# Patient Record
Sex: Female | Born: 1995 | Race: Black or African American | Hispanic: No | Smoking: Current every day smoker
Health system: Southern US, Community
[De-identification: ages and names within clinical notes are randomized; demographics above are authoritative.]

## PROBLEM LIST (undated history)

## (undated) DIAGNOSIS — F329 Major depressive disorder, single episode, unspecified: Secondary | ICD-10-CM

## (undated) HISTORY — PX: ANKLE SURGERY: SHX546

---

## 2015-04-06 ENCOUNTER — Encounter (HOSPITAL_COMMUNITY): Payer: Self-pay

## 2015-04-06 ENCOUNTER — Emergency Department (HOSPITAL_COMMUNITY): Payer: Medicaid Other

## 2015-04-06 ENCOUNTER — Inpatient Hospital Stay (HOSPITAL_COMMUNITY)
Admission: EM | Admit: 2015-04-06 | Discharge: 2015-04-16 | DRG: 988 | Disposition: A | Discharge status: Home / Self Care | Payer: Medicaid Other | Attending: Internal Medicine | Admitting: Internal Medicine

## 2015-04-06 DIAGNOSIS — F1020 Alcohol dependence, uncomplicated: Secondary | ICD-10-CM | POA: Diagnosis present

## 2015-04-06 DIAGNOSIS — F329 Major depressive disorder, single episode, unspecified: Secondary | ICD-10-CM | POA: Diagnosis present

## 2015-04-06 DIAGNOSIS — D72829 Elevated white blood cell count, unspecified: Secondary | ICD-10-CM | POA: Diagnosis present

## 2015-04-06 DIAGNOSIS — S0181XA Laceration without foreign body of other part of head, initial encounter: Secondary | ICD-10-CM | POA: Diagnosis present

## 2015-04-06 DIAGNOSIS — S01112A Laceration without foreign body of left eyelid and periocular area, initial encounter: Secondary | ICD-10-CM | POA: Diagnosis present

## 2015-04-06 DIAGNOSIS — Y905 Blood alcohol level of 100-119 mg/100 ml: Secondary | ICD-10-CM | POA: Diagnosis present

## 2015-04-06 DIAGNOSIS — H538 Other visual disturbances: Secondary | ICD-10-CM | POA: Diagnosis present

## 2015-04-06 DIAGNOSIS — H1130 Conjunctival hemorrhage, unspecified eye: Secondary | ICD-10-CM | POA: Diagnosis not present

## 2015-04-06 DIAGNOSIS — E860 Dehydration: Secondary | ICD-10-CM | POA: Diagnosis present

## 2015-04-06 DIAGNOSIS — N390 Urinary tract infection, site not specified: Secondary | ICD-10-CM

## 2015-04-06 DIAGNOSIS — S0993XA Unspecified injury of face, initial encounter: Secondary | ICD-10-CM | POA: Diagnosis present

## 2015-04-06 DIAGNOSIS — F17210 Nicotine dependence, cigarettes, uncomplicated: Secondary | ICD-10-CM | POA: Diagnosis present

## 2015-04-06 DIAGNOSIS — F10120 Alcohol abuse with intoxication, uncomplicated: Secondary | ICD-10-CM | POA: Diagnosis present

## 2015-04-06 DIAGNOSIS — T796XXA Traumatic ischemia of muscle, initial encounter: Secondary | ICD-10-CM | POA: Diagnosis not present

## 2015-04-06 DIAGNOSIS — T1490 Injury, unspecified: Secondary | ICD-10-CM | POA: Diagnosis not present

## 2015-04-06 DIAGNOSIS — D649 Anemia, unspecified: Secondary | ICD-10-CM | POA: Diagnosis present

## 2015-04-06 DIAGNOSIS — S01111A Laceration without foreign body of right eyelid and periocular area, initial encounter: Secondary | ICD-10-CM | POA: Diagnosis present

## 2015-04-06 DIAGNOSIS — B9620 Unspecified Escherichia coli [E. coli] as the cause of diseases classified elsewhere: Secondary | ICD-10-CM | POA: Diagnosis not present

## 2015-04-06 DIAGNOSIS — N179 Acute kidney failure, unspecified: Secondary | ICD-10-CM | POA: Diagnosis present

## 2015-04-06 DIAGNOSIS — K5900 Constipation, unspecified: Secondary | ICD-10-CM | POA: Diagnosis not present

## 2015-04-06 DIAGNOSIS — R740 Nonspecific elevation of levels of transaminase and lactic acid dehydrogenase [LDH]: Secondary | ICD-10-CM | POA: Diagnosis not present

## 2015-04-06 DIAGNOSIS — H539 Unspecified visual disturbance: Secondary | ICD-10-CM

## 2015-04-06 DIAGNOSIS — S81012A Laceration without foreign body, left knee, initial encounter: Secondary | ICD-10-CM | POA: Diagnosis present

## 2015-04-06 DIAGNOSIS — T1490XA Injury, unspecified, initial encounter: Secondary | ICD-10-CM

## 2015-04-06 DIAGNOSIS — S0993XS Unspecified injury of face, sequela: Secondary | ICD-10-CM | POA: Diagnosis not present

## 2015-04-06 DIAGNOSIS — E876 Hypokalemia: Secondary | ICD-10-CM | POA: Diagnosis present

## 2015-04-06 DIAGNOSIS — R000 Tachycardia, unspecified: Secondary | ICD-10-CM | POA: Diagnosis not present

## 2015-04-06 DIAGNOSIS — M6282 Rhabdomyolysis: Secondary | ICD-10-CM

## 2015-04-06 DIAGNOSIS — F10220 Alcohol dependence with intoxication, uncomplicated: Secondary | ICD-10-CM | POA: Diagnosis not present

## 2015-04-06 DIAGNOSIS — T796XXD Traumatic ischemia of muscle, subsequent encounter: Secondary | ICD-10-CM | POA: Diagnosis not present

## 2015-04-06 DIAGNOSIS — S0993XD Unspecified injury of face, subsequent encounter: Secondary | ICD-10-CM | POA: Diagnosis not present

## 2015-04-06 DIAGNOSIS — R112 Nausea with vomiting, unspecified: Secondary | ICD-10-CM | POA: Diagnosis present

## 2015-04-06 DIAGNOSIS — R1110 Vomiting, unspecified: Secondary | ICD-10-CM | POA: Insufficient documentation

## 2015-04-06 DIAGNOSIS — R7401 Elevation of levels of liver transaminase levels: Secondary | ICD-10-CM | POA: Insufficient documentation

## 2015-04-06 DIAGNOSIS — R509 Fever, unspecified: Secondary | ICD-10-CM | POA: Diagnosis not present

## 2015-04-06 DIAGNOSIS — F1910 Other psychoactive substance abuse, uncomplicated: Secondary | ICD-10-CM | POA: Diagnosis present

## 2015-04-06 DIAGNOSIS — I959 Hypotension, unspecified: Secondary | ICD-10-CM | POA: Diagnosis not present

## 2015-04-06 DIAGNOSIS — S71112A Laceration without foreign body, left thigh, initial encounter: Secondary | ICD-10-CM | POA: Diagnosis present

## 2015-04-06 DIAGNOSIS — F32A Depression, unspecified: Secondary | ICD-10-CM | POA: Diagnosis present

## 2015-04-06 DIAGNOSIS — IMO0002 Reserved for concepts with insufficient information to code with codable children: Secondary | ICD-10-CM

## 2015-04-06 HISTORY — DX: Depression, unspecified: F32.A

## 2015-04-06 HISTORY — DX: Major depressive disorder, single episode, unspecified: F32.9

## 2015-04-06 LAB — CBC WITH DIFFERENTIAL/PLATELET
BASOS PCT: 0 %
Basophils Absolute: 0 10*3/uL (ref 0.0–0.1)
EOS PCT: 0 %
Eosinophils Absolute: 0 10*3/uL (ref 0.0–0.7)
HEMATOCRIT: 33.2 % — AB (ref 36.0–46.0)
Hemoglobin: 11.3 g/dL — ABNORMAL LOW (ref 12.0–15.0)
Lymphocytes Relative: 6 %
Lymphs Abs: 1.7 10*3/uL (ref 0.7–4.0)
MCH: 31 pg (ref 26.0–34.0)
MCHC: 34 g/dL (ref 30.0–36.0)
MCV: 91.2 fL (ref 78.0–100.0)
MONO ABS: 3.5 10*3/uL — AB (ref 0.1–1.0)
MONOS PCT: 12 %
NEUTROS PCT: 82 %
Neutro Abs: 23.6 10*3/uL — ABNORMAL HIGH (ref 1.7–7.7)
PLATELETS: 218 10*3/uL (ref 150–400)
RBC: 3.64 MIL/uL — AB (ref 3.87–5.11)
RDW: 13.8 % (ref 11.5–15.5)
WBC: 28.8 10*3/uL — AB (ref 4.0–10.5)

## 2015-04-06 LAB — I-STAT BETA HCG BLOOD, ED (MC, WL, AP ONLY)

## 2015-04-06 LAB — COMPREHENSIVE METABOLIC PANEL
ALK PHOS: 67 U/L (ref 38–126)
ALT: 32 U/L (ref 14–54)
AST: 101 U/L — AB (ref 15–41)
Albumin: 4.3 g/dL (ref 3.5–5.0)
Anion gap: 20 — ABNORMAL HIGH (ref 5–15)
BILIRUBIN TOTAL: 0.6 mg/dL (ref 0.3–1.2)
BUN: 16 mg/dL (ref 6–20)
CALCIUM: 9 mg/dL (ref 8.9–10.3)
CO2: 15 mmol/L — ABNORMAL LOW (ref 22–32)
CREATININE: 1.41 mg/dL — AB (ref 0.44–1.00)
Chloride: 108 mmol/L (ref 101–111)
GFR, EST NON AFRICAN AMERICAN: 53 mL/min — AB (ref >60)
Glucose, Bld: 68 mg/dL (ref 65–99)
Potassium: 3.1 mmol/L — ABNORMAL LOW (ref 3.5–5.1)
Sodium: 143 mmol/L (ref 135–145)
TOTAL PROTEIN: 7.7 g/dL (ref 6.5–8.1)

## 2015-04-06 LAB — RAPID URINE DRUG SCREEN, HOSP PERFORMED
AMPHETAMINES: NOT DETECTED
Barbiturates: POSITIVE — AB
Benzodiazepines: NOT DETECTED
Cocaine: NOT DETECTED
OPIATES: NOT DETECTED
Tetrahydrocannabinol: POSITIVE — AB

## 2015-04-06 LAB — URINALYSIS, ROUTINE W REFLEX MICROSCOPIC
Bilirubin Urine: NEGATIVE
Glucose, UA: NEGATIVE mg/dL
Ketones, ur: 40 mg/dL — AB
Leukocytes, UA: NEGATIVE
Nitrite: NEGATIVE
Protein, ur: 100 mg/dL — AB
Specific Gravity, Urine: 1.036 — ABNORMAL HIGH (ref 1.005–1.030)
pH: 5.5 (ref 5.0–8.0)

## 2015-04-06 LAB — MAGNESIUM: Magnesium: 2.3 mg/dL (ref 1.7–2.4)

## 2015-04-06 LAB — URINE MICROSCOPIC-ADD ON

## 2015-04-06 LAB — MRSA PCR SCREENING: MRSA by PCR: NEGATIVE

## 2015-04-06 LAB — SAMPLE TO BLOOD BANK

## 2015-04-06 LAB — ETHANOL: ALCOHOL ETHYL (B): 118 mg/dL — AB (ref <5)

## 2015-04-06 MED ORDER — VITAMIN B-1 100 MG PO TABS
100.0000 mg | ORAL_TABLET | Freq: Every day | ORAL | Status: DC
  Administered 2015-04-08 – 2015-04-16 (×9): 100 mg via ORAL
  Filled 2015-04-06 (×9): qty 1

## 2015-04-06 MED ORDER — ADULT MULTIVITAMIN W/MINERALS CH
1.0000 | ORAL_TABLET | Freq: Every day | ORAL | Status: DC
  Administered 2015-04-07 – 2015-04-16 (×10): 1 via ORAL
  Filled 2015-04-06 (×10): qty 1

## 2015-04-06 MED ORDER — MAGNESIUM CITRATE PO SOLN: 1.0000 | Freq: Once | ORAL | Status: DC | PRN

## 2015-04-06 MED ORDER — IPRATROPIUM BROMIDE 0.02 % IN SOLN: 0.5000 mg | RESPIRATORY_TRACT | Status: DC | PRN

## 2015-04-06 MED ORDER — MORPHINE SULFATE (PF) 2 MG/ML IV SOLN
2.0000 mg | INTRAVENOUS | Status: DC | PRN
  Administered 2015-04-06 – 2015-04-13 (×27): 2 mg via INTRAVENOUS
  Filled 2015-04-06 (×29): qty 1

## 2015-04-06 MED ORDER — ACETAMINOPHEN 325 MG PO TABS
650.0000 mg | ORAL_TABLET | Freq: Four times a day (QID) | ORAL | Status: DC | PRN
  Administered 2015-04-09: 650 mg via ORAL
  Filled 2015-04-06: qty 2

## 2015-04-06 MED ORDER — SENNOSIDES-DOCUSATE SODIUM 8.6-50 MG PO TABS: 1.0000 | ORAL_TABLET | Freq: Every evening | ORAL | Status: DC | PRN

## 2015-04-06 MED ORDER — ONDANSETRON HCL 4 MG/2ML IJ SOLN
4.0000 mg | Freq: Once | INTRAMUSCULAR | Status: AC
  Administered 2015-04-06: 4 mg via INTRAVENOUS
  Filled 2015-04-06: qty 2

## 2015-04-06 MED ORDER — LORAZEPAM 1 MG PO TABS
2.0000 mg | ORAL_TABLET | Freq: Four times a day (QID) | ORAL | Status: AC | PRN
  Administered 2015-04-08: 2 mg via ORAL
  Filled 2015-04-06: qty 2

## 2015-04-06 MED ORDER — SODIUM CHLORIDE 0.9 % IV BOLUS (SEPSIS)
1000.0000 mL | Freq: Once | INTRAVENOUS | Status: AC
  Administered 2015-04-06: 1000 mL via INTRAVENOUS

## 2015-04-06 MED ORDER — ACETAMINOPHEN 650 MG RE SUPP
650.0000 mg | Freq: Four times a day (QID) | RECTAL | Status: DC | PRN
  Administered 2015-04-10: 650 mg via RECTAL
  Filled 2015-04-06: qty 1

## 2015-04-06 MED ORDER — DOCUSATE SODIUM 100 MG PO CAPS
100.0000 mg | ORAL_CAPSULE | Freq: Two times a day (BID) | ORAL | Status: DC
  Administered 2015-04-06 – 2015-04-16 (×19): 100 mg via ORAL
  Filled 2015-04-06 (×20): qty 1

## 2015-04-06 MED ORDER — BUPIVACAINE HCL (PF) 0.5 % IJ SOLN
20.0000 mL | Freq: Once | INTRAMUSCULAR | Status: AC
  Administered 2015-04-06: 20 mL
  Filled 2015-04-06: qty 30

## 2015-04-06 MED ORDER — PROCHLORPERAZINE EDISYLATE 5 MG/ML IJ SOLN
10.0000 mg | Freq: Four times a day (QID) | INTRAMUSCULAR | Status: DC | PRN
  Administered 2015-04-06: 10 mg via INTRAVENOUS
  Filled 2015-04-06 (×2): qty 2

## 2015-04-06 MED ORDER — SODIUM CHLORIDE 0.9 % IV SOLN
8.0000 mg | Freq: Four times a day (QID) | INTRAVENOUS | Status: DC | PRN
Start: 2015-04-06 — End: 2015-04-16
  Administered 2015-04-06: 8 mg via INTRAVENOUS
  Filled 2015-04-06: qty 4

## 2015-04-06 MED ORDER — SODIUM CHLORIDE 0.9 % IJ SOLN
3.0000 mL | Freq: Two times a day (BID) | INTRAMUSCULAR | Status: DC
  Administered 2015-04-07 – 2015-04-14 (×14): 3 mL via INTRAVENOUS

## 2015-04-06 MED ORDER — POTASSIUM CHLORIDE 2 MEQ/ML IV SOLN
INTRAVENOUS | Status: DC
  Administered 2015-04-06 – 2015-04-10 (×6): via INTRAVENOUS
  Filled 2015-04-06 (×16): qty 1000

## 2015-04-06 MED ORDER — ENOXAPARIN SODIUM 40 MG/0.4ML ~~LOC~~ SOLN
40.0000 mg | SUBCUTANEOUS | Status: DC
  Administered 2015-04-06: 40 mg via SUBCUTANEOUS
  Filled 2015-04-06: qty 0.4

## 2015-04-06 MED ORDER — FLUOXETINE HCL 20 MG PO CAPS
20.0000 mg | ORAL_CAPSULE | Freq: Every day | ORAL | Status: DC
  Administered 2015-04-06 – 2015-04-09 (×4): 20 mg via ORAL
  Filled 2015-04-06 (×4): qty 1

## 2015-04-06 MED ORDER — BACITRACIN ZINC 500 UNIT/GM EX OINT
TOPICAL_OINTMENT | Freq: Two times a day (BID) | CUTANEOUS | Status: DC
  Administered 2015-04-06: 1 via TOPICAL
  Administered 2015-04-06 – 2015-04-16 (×19): via TOPICAL
  Filled 2015-04-06 (×10): qty 28.35
  Filled 2015-04-06: qty 3.6
  Filled 2015-04-06: qty 28.35

## 2015-04-06 MED ORDER — IOHEXOL 300 MG/ML  SOLN
80.0000 mL | Freq: Once | INTRAMUSCULAR | Status: AC | PRN
  Administered 2015-04-06: 80 mL via INTRAVENOUS

## 2015-04-06 MED ORDER — MIRTAZAPINE 15 MG PO TABS: 15.0000 mg | ORAL_TABLET | Freq: Every evening | ORAL | Status: DC | PRN

## 2015-04-06 MED ORDER — MORPHINE SULFATE (PF) 4 MG/ML IV SOLN
4.0000 mg | Freq: Once | INTRAVENOUS | Status: AC
  Administered 2015-04-06: 4 mg via INTRAVENOUS
  Filled 2015-04-06: qty 1

## 2015-04-06 MED ORDER — FOLIC ACID 1 MG PO TABS
1.0000 mg | ORAL_TABLET | Freq: Every day | ORAL | Status: DC
  Administered 2015-04-07 – 2015-04-16 (×10): 1 mg via ORAL
  Filled 2015-04-06 (×10): qty 1

## 2015-04-06 MED ORDER — ONDANSETRON HCL 4 MG/2ML IJ SOLN
4.0000 mg | Freq: Once | INTRAMUSCULAR | Status: AC
Start: 2015-04-06 — End: 2015-04-06
  Administered 2015-04-06: 4 mg via INTRAVENOUS
  Filled 2015-04-06: qty 2

## 2015-04-06 MED ORDER — POTASSIUM CHLORIDE CRYS ER 20 MEQ PO TBCR
40.0000 meq | EXTENDED_RELEASE_TABLET | Freq: Once | ORAL | Status: DC
  Filled 2015-04-06: qty 2

## 2015-04-06 MED ORDER — LORAZEPAM 2 MG/ML IJ SOLN: 2.0000 mg | Freq: Four times a day (QID) | INTRAMUSCULAR | Status: AC | PRN

## 2015-04-06 MED ORDER — SORBITOL 70 % SOLN
30.0000 mL | Freq: Every day | Status: DC | PRN
  Filled 2015-04-06: qty 30

## 2015-04-06 MED ORDER — THIAMINE HCL 100 MG/ML IJ SOLN
100.0000 mg | Freq: Every day | INTRAMUSCULAR | Status: DC
  Administered 2015-04-06 – 2015-04-07 (×2): 100 mg via INTRAVENOUS
  Filled 2015-04-06 (×2): qty 2

## 2015-04-06 MED ORDER — LEVALBUTEROL HCL 0.63 MG/3ML IN NEBU: 0.6300 mg | INHALATION_SOLUTION | RESPIRATORY_TRACT | Status: DC | PRN

## 2015-04-06 MED ORDER — OXYCODONE HCL 5 MG PO TABS
5.0000 mg | ORAL_TABLET | ORAL | Status: DC | PRN
  Administered 2015-04-07 – 2015-04-16 (×37): 5 mg via ORAL
  Filled 2015-04-06 (×38): qty 1
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
## 2015-04-06 NOTE — ED Notes (Signed)
ELECTRIC MONITORING DEVICE TO LLE INTACT

## 2015-04-06 NOTE — ED Notes (Signed)
Pt at present on bed pan attempting urine sample
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
## 2015-04-06 NOTE — ED Notes (Signed)
EDPA SUTURING AT BS

## 2015-04-06 NOTE — ED Notes (Signed)
Patient transported to CT 

## 2015-04-06 NOTE — ED Notes (Signed)
INQUIRED ABOUT B[facility] STATUS AT [facility]. KAREN UNIT SECT CALLED-NOT AVAILABLE (BEING CLEANED)

## 2015-04-06 NOTE — ED Notes (Signed)
Pt's clothing (gray sweat pants and gray sweat shirt) retrieved from garbage by CSI A.J. Norberto SorensonWashburn.  Boots remain at bedside.

## 2015-04-06 NOTE — ED Notes (Signed)
CSW PRESENT SPEAKING WITH PT
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
## 2015-04-06 NOTE — ED Notes (Signed)
MD at bedside. ADMITTING Paula Cortez PRESENT

## 2015-04-06 NOTE — ED Notes (Signed)
ADMITTING MD DECLINED ADMISSION BED HERE AT [place] [facility]. CHARGE ALAINA RN MADE AWARE.
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
## 2015-04-06 NOTE — Progress Notes (Signed)
Received [facility] from [facility] after getting report from [place]. Pt placed on CCM; CHG bath given, MRSA swab obtained.  CCMD notified. VSS. Noted swelling to both orbitals and sutured lacs to forehead, lt thigh lt knee and above rt eye. Alert and oriented x 4.
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
## 2015-04-06 NOTE — ED Notes (Signed)
[employer] present speaking with pt

## 2015-04-06 NOTE — ED Notes (Signed)
Pt can go to floor at 18:43
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
## 2015-04-06 NOTE — ED Notes (Signed)
Pt placed on bedpan but unable to void at this time.
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
## 2015-04-06 NOTE — ED Notes (Signed)
CALL TRAUMA WHEN PT ARRIVES TO FLOOR AT [facility]
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
## 2015-04-06 NOTE — ED Notes (Signed)
Nurse drawing labs. 

## 2015-04-06 NOTE — ED Notes (Signed)
OFFICER [employer]ACE PRESENT TO ASSIST PT WITH REPORT

## 2015-04-07 DIAGNOSIS — F329 Major depressive disorder, single episode, unspecified: Secondary | ICD-10-CM

## 2015-04-07 DIAGNOSIS — I9589 Other hypotension: Secondary | ICD-10-CM

## 2015-04-07 DIAGNOSIS — D72829 Elevated white blood cell count, unspecified: Secondary | ICD-10-CM

## 2015-04-07 DIAGNOSIS — D62 Acute posthemorrhagic anemia: Secondary | ICD-10-CM

## 2015-04-07 LAB — COMPREHENSIVE METABOLIC PANEL
ALBUMIN: 2.9 g/dL — AB (ref 3.5–5.0)
ALK PHOS: 41 U/L (ref 38–126)
ALT: 54 U/L (ref 14–54)
AST: 281 U/L — AB (ref 15–41)
Anion gap: 8 (ref 5–15)
BILIRUBIN TOTAL: 0.7 mg/dL (ref 0.3–1.2)
BUN: 9 mg/dL (ref 6–20)
CO2: 20 mmol/L — ABNORMAL LOW (ref 22–32)
CREATININE: 0.78 mg/dL (ref 0.44–1.00)
Calcium: 8.1 mg/dL — ABNORMAL LOW (ref 8.9–10.3)
Chloride: 108 mmol/L (ref 101–111)
GFR calc Af Amer: >60 mL/min (ref >60)
GLUCOSE: 85 mg/dL (ref 65–99)
Potassium: 4.6 mmol/L (ref 3.5–5.1)
Sodium: 136 mmol/L (ref 135–145)
TOTAL PROTEIN: 5.4 g/dL — AB (ref 6.5–8.1)

## 2015-04-07 LAB — HIV ANTIBODY (ROUTINE TESTING W REFLEX): HIV Screen 4th Generation wRfx: NONREACTIVE

## 2015-04-07 LAB — CBC WITH DIFFERENTIAL/PLATELET
BASOS ABS: 0 10*3/uL (ref 0.0–0.1)
Basophils Relative: 0 %
EOS ABS: 0 10*3/uL (ref 0.0–0.7)
EOS PCT: 0 %
HCT: 22.4 % — ABNORMAL LOW (ref 36.0–46.0)
Hemoglobin: 7.6 g/dL — ABNORMAL LOW (ref 12.0–15.0)
LYMPHS PCT: 21 %
Lymphs Abs: 2.5 10*3/uL (ref 0.7–4.0)
MCH: 30.5 pg (ref 26.0–34.0)
MCHC: 33.9 g/dL (ref 30.0–36.0)
MCV: 90 fL (ref 78.0–100.0)
MONO ABS: 1 10*3/uL (ref 0.1–1.0)
Monocytes Relative: 8 %
Neutro Abs: 8.5 10*3/uL — ABNORMAL HIGH (ref 1.7–7.7)
Neutrophils Relative %: 71 %
PLATELETS: 130 10*3/uL — AB (ref 150–400)
RBC: 2.49 MIL/uL — AB (ref 3.87–5.11)
RDW: 14.1 % (ref 11.5–15.5)
WBC: 12 10*3/uL — AB (ref 4.0–10.5)

## 2015-04-07 LAB — IRON AND TIBC
IRON: 90 ug/dL (ref 28–170)
SATURATION RATIOS: 30 % (ref 10.4–31.8)
TIBC: 301 ug/dL (ref 250–450)
UIBC: 211 ug/dL

## 2015-04-07 LAB — HEMOGLOBIN AND HEMATOCRIT, BLOOD
HEMATOCRIT: 23.5 % — AB (ref 36.0–46.0)
Hemoglobin: 8 g/dL — ABNORMAL LOW (ref 12.0–15.0)

## 2015-04-07 MED ORDER — SODIUM CHLORIDE 0.9 % IV SOLN
500.0000 mL | Freq: Once | INTRAVENOUS | Status: AC
  Administered 2015-04-07: 500 mL via INTRAVENOUS

## 2015-04-07 MED ORDER — SODIUM CHLORIDE 0.9 % IV BOLUS (SEPSIS)
1000.0000 mL | Freq: Once | INTRAVENOUS | Status: AC
  Administered 2015-04-07: 1000 mL via INTRAVENOUS
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
## 2015-04-07 NOTE — Progress Notes (Signed)
Utilization Review Completed.  
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
## 2015-04-08 ENCOUNTER — Inpatient Hospital Stay (HOSPITAL_COMMUNITY): Payer: Medicaid Other

## 2015-04-08 DIAGNOSIS — D649 Anemia, unspecified: Secondary | ICD-10-CM

## 2015-04-08 LAB — BASIC METABOLIC PANEL
Anion gap: 7 (ref 5–15)
CHLORIDE: 106 mmol/L (ref 101–111)
CO2: 22 mmol/L (ref 22–32)
CREATININE: 0.77 mg/dL (ref 0.44–1.00)
Calcium: 8.3 mg/dL — ABNORMAL LOW (ref 8.9–10.3)
GFR calc non Af Amer: >60 mL/min (ref >60)
Glucose, Bld: 144 mg/dL — ABNORMAL HIGH (ref 65–99)
POTASSIUM: 4.2 mmol/L (ref 3.5–5.1)
Sodium: 135 mmol/L (ref 135–145)

## 2015-04-08 LAB — HEMOGLOBIN AND HEMATOCRIT, BLOOD
HCT: 21 % — ABNORMAL LOW (ref 36.0–46.0)
HEMATOCRIT: 26.4 % — AB (ref 36.0–46.0)
HEMOGLOBIN: 9 g/dL — AB (ref 12.0–15.0)
Hemoglobin: 7 g/dL — ABNORMAL LOW (ref 12.0–15.0)

## 2015-04-08 LAB — HEPATIC FUNCTION PANEL
ALBUMIN: 2.7 g/dL — AB (ref 3.5–5.0)
ALT: 107 U/L — ABNORMAL HIGH (ref 14–54)
AST: 491 U/L — AB (ref 15–41)
Alkaline Phosphatase: 38 U/L (ref 38–126)
BILIRUBIN TOTAL: 0.5 mg/dL (ref 0.3–1.2)
Bilirubin, Direct: 0.1 mg/dL (ref 0.1–0.5)
Indirect Bilirubin: 0.4 mg/dL (ref 0.3–0.9)
Total Protein: 5.2 g/dL — ABNORMAL LOW (ref 6.5–8.1)

## 2015-04-08 LAB — URINE CULTURE

## 2015-04-08 LAB — PREPARE RBC (CROSSMATCH)

## 2015-04-08 LAB — ABO/RH: ABO/RH(D): O POS

## 2015-04-08 MED ORDER — SODIUM CHLORIDE 0.9 % IV SOLN: Freq: Once | INTRAVENOUS | Status: DC
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
## 2015-04-08 NOTE — Progress Notes (Signed)
Pt admitted to the unit at 1100 transfer from [facility]. Pt mental status is alert, verbal, oriented x4. Does not want to talk much during conversation. Pt oriented to room, staff, and call bell. Full assessment charted in [facility]. Call bell within reach. Visitor guidelines reviewed w/ pt and/or family. Blood transfusing upon admission, consent form in chart. No adverse reactions at this time, will continue to monitor
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
## 2015-04-09 LAB — TYPE AND SCREEN
ABO/RH(D): O POS
ANTIBODY SCREEN: NEGATIVE
Unit division: 0

## 2015-04-09 LAB — COMPREHENSIVE METABOLIC PANEL
ALBUMIN: 3.1 g/dL — AB (ref 3.5–5.0)
ALK PHOS: 43 U/L (ref 38–126)
ALT: 151 U/L — ABNORMAL HIGH (ref 14–54)
AST: 648 U/L — AB (ref 15–41)
Anion gap: 9 (ref 5–15)
BILIRUBIN TOTAL: 0.4 mg/dL (ref 0.3–1.2)
CO2: 26 mmol/L (ref 22–32)
Calcium: 8.8 mg/dL — ABNORMAL LOW (ref 8.9–10.3)
Chloride: 101 mmol/L (ref 101–111)
Creatinine, Ser: 0.78 mg/dL (ref 0.44–1.00)
GFR calc Af Amer: >60 mL/min (ref >60)
GFR calc non Af Amer: >60 mL/min (ref >60)
GLUCOSE: 111 mg/dL — AB (ref 65–99)
POTASSIUM: 3.8 mmol/L (ref 3.5–5.1)
SODIUM: 136 mmol/L (ref 135–145)
TOTAL PROTEIN: 6.1 g/dL — AB (ref 6.5–8.1)

## 2015-04-09 LAB — HEPATITIS PANEL, ACUTE
HCV Ab: 0.1 s/co ratio (ref 0.0–0.9)
HEP B S AG: NEGATIVE
Hep A IgM: NEGATIVE
Hep B C IgM: NEGATIVE

## 2015-04-09 LAB — HEMOGLOBIN AND HEMATOCRIT, BLOOD
HCT: 26.2 % — ABNORMAL LOW (ref 36.0–46.0)
HEMOGLOBIN: 9.1 g/dL — AB (ref 12.0–15.0)
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
## 2015-04-09 NOTE — Care Management Note (Signed)
Case Management Note  Patient Details  Name: Paula Cortez MRN: [id number] Date of Birth: September 12, 1995  Subjective/Objective:                  Date-04-09-15 Initial Assessment  Spoke with patient at the bedside Introduced self as case manager and explained role in discharge planning and how to be reached.  [place]fri[place]. [name] states that she feels safe returning to the living environment she came from. Verified patient anticipates to go home with boyfriend.  Patient has no DME  Patient denied  needing help with their medication. Patient states she has medicaid Patient is driven by boyfriend or takes the bus to MD appointments.  Verified patient has no PCP. Will follow up with [facility].  Plan: CM will continue to follow for discharge planning and [facility].   Paula Sabalebbie Jenascia Bumpass RN BSN CM [phone]   Action/Plan:  [facility], anticipate DC tomorrow.   Expected Discharge Date:   (unknown)               Expected Discharge Plan:  Home/Self Care  In-House Referral:  Clinical Social Work  Discharge planning Services  CM Consult, Indigent Health Clinic  Post Acute Care Choice:    Choice offered to:     DME Arranged:    DME Agency:     HH Arranged:    HH Agency:     Status of Service:  Completed, signed off  Medicare Important Message Given:    Date Medicare IM Given:    Medicare IM give by:    Date Additional Medicare IM Given:    Additional Medicare Important Message give by:     If discussed at Long Length of Stay Meetings, dates discussed:    Additional Comments:  Paula SabalDebbie Orlanda Lemmerman, RN 04/09/2015, 11:43 AM
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
## 2015-04-10 ENCOUNTER — Inpatient Hospital Stay (HOSPITAL_COMMUNITY): Payer: Medicaid Other

## 2015-04-10 DIAGNOSIS — T796XXA Traumatic ischemia of muscle, initial encounter: Secondary | ICD-10-CM

## 2015-04-10 LAB — CBC
HCT: 24.5 % — ABNORMAL LOW (ref 36.0–46.0)
Hemoglobin: 8.4 g/dL — ABNORMAL LOW (ref 12.0–15.0)
MCH: 30.2 pg (ref 26.0–34.0)
MCHC: 34.3 g/dL (ref 30.0–36.0)
MCV: 88.1 fL (ref 78.0–100.0)
PLATELETS: 192 10*3/uL (ref 150–400)
RBC: 2.78 MIL/uL — AB (ref 3.87–5.11)
RDW: 16.2 % — AB (ref 11.5–15.5)
WBC: 10.7 10*3/uL — AB (ref 4.0–10.5)

## 2015-04-10 LAB — URINE MICROSCOPIC-ADD ON

## 2015-04-10 LAB — BASIC METABOLIC PANEL
Anion gap: 10 (ref 5–15)
CHLORIDE: 106 mmol/L (ref 101–111)
CO2: 21 mmol/L — AB (ref 22–32)
Calcium: 8.8 mg/dL — ABNORMAL LOW (ref 8.9–10.3)
Creatinine, Ser: 0.69 mg/dL (ref 0.44–1.00)
GFR calc non Af Amer: >60 mL/min (ref >60)
Glucose, Bld: 101 mg/dL — ABNORMAL HIGH (ref 65–99)
POTASSIUM: 4.1 mmol/L (ref 3.5–5.1)
SODIUM: 137 mmol/L (ref 135–145)

## 2015-04-10 LAB — URINALYSIS, ROUTINE W REFLEX MICROSCOPIC
Bilirubin Urine: NEGATIVE
Glucose, UA: NEGATIVE mg/dL
Ketones, ur: NEGATIVE mg/dL
NITRITE: NEGATIVE
PH: 7.5 (ref 5.0–8.0)
Protein, ur: 30 mg/dL — AB
SPECIFIC GRAVITY, URINE: 1.01 (ref 1.005–1.030)

## 2015-04-10 LAB — HEPATIC FUNCTION PANEL
ALT: 183 U/L — ABNORMAL HIGH (ref 14–54)
AST: 727 U/L — AB (ref 15–41)
Albumin: 3 g/dL — ABNORMAL LOW (ref 3.5–5.0)
Alkaline Phosphatase: 40 U/L (ref 38–126)
BILIRUBIN DIRECT: 0.2 mg/dL (ref 0.1–0.5)
BILIRUBIN INDIRECT: 0.2 mg/dL — AB (ref 0.3–0.9)
BILIRUBIN TOTAL: 0.4 mg/dL (ref 0.3–1.2)
Total Protein: 5.8 g/dL — ABNORMAL LOW (ref 6.5–8.1)

## 2015-04-10 LAB — PROTIME-INR
INR: 0.97 (ref 0.00–1.49)
Prothrombin Time: 13.1 seconds (ref 11.6–15.2)

## 2015-04-10 LAB — CK: Total CK: >50000 U/L — ABNORMAL HIGH (ref 38–234)

## 2015-04-10 LAB — SALICYLATE LEVEL: Salicylate Lvl: <4 mg/dL (ref 2.8–30.0)

## 2015-04-10 LAB — ACETAMINOPHEN LEVEL: Acetaminophen (Tylenol), Serum: <10 ug/mL — ABNORMAL LOW (ref 10–30)

## 2015-04-10 MED ORDER — SODIUM CHLORIDE 0.9 % IV SOLN: INTRAVENOUS | Status: DC

## 2015-04-10 MED ORDER — IBUPROFEN 400 MG PO TABS
400.0000 mg | ORAL_TABLET | Freq: Three times a day (TID) | ORAL | Status: DC | PRN
  Administered 2015-04-10 – 2015-04-11 (×2): 400 mg via ORAL
  Filled 2015-04-10 (×2): qty 1

## 2015-04-10 MED ORDER — SODIUM CHLORIDE 0.9 % IV BOLUS (SEPSIS)
500.0000 mL | Freq: Once | INTRAVENOUS | Status: AC
  Administered 2015-04-10: 500 mL via INTRAVENOUS

## 2015-04-10 MED ORDER — SODIUM CHLORIDE 0.9 % IV SOLN
INTRAVENOUS | Status: DC
  Administered 2015-04-10 – 2015-04-16 (×17): via INTRAVENOUS

## 2015-04-10 MED ORDER — LEVALBUTEROL HCL 0.63 MG/3ML IN NEBU: 0.6300 mg | INHALATION_SOLUTION | Freq: Four times a day (QID) | RESPIRATORY_TRACT | Status: DC | PRN

## 2015-04-10 MED ORDER — CEFTRIAXONE SODIUM 1 G IJ SOLR
1.0000 g | INTRAMUSCULAR | Status: DC
  Administered 2015-04-10 – 2015-04-11 (×2): 1 g via INTRAVENOUS
  Filled 2015-04-10 (×3): qty 10

## 2015-04-10 NOTE — Progress Notes (Signed)
Was not made aware that a temp was noted from earlier vitals of 100.5. Went to assess vitals again and pt. Temp increased to 103.0. Made MD aware that pt. Was on the verge of being septic. MD ordered ibprofen for temp and U/A. B/P did rise from earlier. 1945: Pt. U/A resulted, paged night coverage NP. Orders placed for IV Rocephin and 500cc bolus Reported off to night nurse (Asia, RN) to follow up with care.
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
## 2015-04-10 NOTE — Care Management Note (Signed)
Case Management Note  Patient Details  Name: Lunette Standsavia Bomar MRN: [id number] Date of Birth: 07/13/1995  Subjective/Objective:                    Action/Plan:   Expected Discharge Date:   (unknown)               Expected Discharge Plan:  Home/Self Care  In-House Referral:  Clinical Social Work  Discharge planning Services  CM Consult, Indigent Health Clinic  Post Acute Care Choice:    Choice offered to:     DME Arranged:    DME Agency:     HH Arranged:    HH Agency:     Status of Service:  Completed, signed off  Medicare Important Message Given:    Date Medicare IM Given:    Medicare IM give by:    Date Additional Medicare IM Given:    Additional Medicare Important Message give by:     If discussed at Long Length of Stay Meetings, dates discussed:    Additional Comments:  Lawerance SabalDebbie Harlean Regula, RN 04/10/2015, 10:46 AM

## 2015-04-10 NOTE — Progress Notes (Signed)
Patient received pamphlet from [facility]. CM explained to patient that they may use the on site pharmacy to fill prescriptions given to them at discharge. Patient aware that the [facility] will not fill narcotics or pain medications prior to the patient being seen by one of their physicians.  Patient aware that they must be seen as a patient prior to the pharmacy filling the prescriptions a second time.  

## 2015-04-10 NOTE — Progress Notes (Signed)
PT Cancellation Note and Discharge  Patient Details Name: Paula Cortez MRN: [id number] DOB: Aug 09, 1995   Cancelled Treatment:    Reason Eval/Treat Not Completed: Pt sleeping when PT arrived and declined working with therapy. Did not provide a reason initially, however when asked again pt states her head hurts. RN notified. Discussed pt case with RN and NT and both report that pt is independent in room with mobility. Will sign off at this time. If needs change, please reconsult.    Conni SlipperKirkman, Telesha Deguzman 04/10/2015, 12:22 PM   Conni SlipperLaura Askari Kinley, PT, DPT Acute Rehabilitation Services Pager: [phone]

## 2015-04-11 ENCOUNTER — Inpatient Hospital Stay (HOSPITAL_COMMUNITY): Payer: Medicaid Other

## 2015-04-11 DIAGNOSIS — E876 Hypokalemia: Secondary | ICD-10-CM

## 2015-04-11 DIAGNOSIS — T796XXD Traumatic ischemia of muscle, subsequent encounter: Secondary | ICD-10-CM

## 2015-04-11 DIAGNOSIS — F1910 Other psychoactive substance abuse, uncomplicated: Secondary | ICD-10-CM

## 2015-04-11 DIAGNOSIS — R509 Fever, unspecified: Secondary | ICD-10-CM | POA: Insufficient documentation

## 2015-04-11 DIAGNOSIS — R740 Nonspecific elevation of levels of transaminase and lactic acid dehydrogenase [LDH]: Secondary | ICD-10-CM

## 2015-04-11 DIAGNOSIS — M6282 Rhabdomyolysis: Secondary | ICD-10-CM

## 2015-04-11 DIAGNOSIS — E860 Dehydration: Secondary | ICD-10-CM

## 2015-04-11 DIAGNOSIS — T1490 Injury, unspecified: Secondary | ICD-10-CM

## 2015-04-11 DIAGNOSIS — R7401 Elevation of levels of liver transaminase levels: Secondary | ICD-10-CM | POA: Insufficient documentation

## 2015-04-11 LAB — CBC WITH DIFFERENTIAL/PLATELET
BASOS PCT: 0 %
Basophils Absolute: 0 10*3/uL (ref 0.0–0.1)
EOS ABS: 0.1 10*3/uL (ref 0.0–0.7)
Eosinophils Relative: 1 %
HCT: 21.9 % — ABNORMAL LOW (ref 36.0–46.0)
HEMOGLOBIN: 7.3 g/dL — AB (ref 12.0–15.0)
Lymphocytes Relative: 9 %
Lymphs Abs: 0.9 10*3/uL (ref 0.7–4.0)
MCH: 29.9 pg (ref 26.0–34.0)
MCHC: 33.3 g/dL (ref 30.0–36.0)
MCV: 89.8 fL (ref 78.0–100.0)
Monocytes Absolute: 0.6 10*3/uL (ref 0.1–1.0)
Monocytes Relative: 6 %
NEUTROS PCT: 84 %
Neutro Abs: 8.5 10*3/uL — ABNORMAL HIGH (ref 1.7–7.7)
Platelets: 178 10*3/uL (ref 150–400)
RBC: 2.44 MIL/uL — AB (ref 3.87–5.11)
RDW: 16.5 % — ABNORMAL HIGH (ref 11.5–15.5)
WBC: 10 10*3/uL (ref 4.0–10.5)

## 2015-04-11 LAB — COMPREHENSIVE METABOLIC PANEL
ALBUMIN: 2.4 g/dL — AB (ref 3.5–5.0)
ALT: 171 U/L — AB (ref 14–54)
AST: 445 U/L — AB (ref 15–41)
Alkaline Phosphatase: 42 U/L (ref 38–126)
Anion gap: 6 (ref 5–15)
CHLORIDE: 112 mmol/L — AB (ref 101–111)
CO2: 23 mmol/L (ref 22–32)
CREATININE: 0.74 mg/dL (ref 0.44–1.00)
Calcium: 8.5 mg/dL — ABNORMAL LOW (ref 8.9–10.3)
GFR calc Af Amer: >60 mL/min (ref >60)
GLUCOSE: 129 mg/dL — AB (ref 65–99)
POTASSIUM: 3.6 mmol/L (ref 3.5–5.1)
Sodium: 141 mmol/L (ref 135–145)
Total Bilirubin: 0.7 mg/dL (ref 0.3–1.2)
Total Protein: 5.2 g/dL — ABNORMAL LOW (ref 6.5–8.1)

## 2015-04-11 LAB — CULTURE, BLOOD (ROUTINE X 2)
CULTURE: NO GROWTH
Culture: NO GROWTH

## 2015-04-11 LAB — BASIC METABOLIC PANEL
Anion gap: 6 (ref 5–15)
BUN: <5 mg/dL — ABNORMAL LOW (ref 6–20)
CHLORIDE: 105 mmol/L (ref 101–111)
CO2: 23 mmol/L (ref 22–32)
CREATININE: 0.86 mg/dL (ref 0.44–1.00)
Calcium: 7.9 mg/dL — ABNORMAL LOW (ref 8.9–10.3)
GFR calc non Af Amer: >60 mL/min (ref >60)
Glucose, Bld: 121 mg/dL — ABNORMAL HIGH (ref 65–99)
POTASSIUM: 3.6 mmol/L (ref 3.5–5.1)
SODIUM: 134 mmol/L — AB (ref 135–145)

## 2015-04-11 LAB — CK: CK TOTAL: 38150 U/L — AB (ref 38–234)

## 2015-04-11 LAB — LACTIC ACID, PLASMA
LACTIC ACID, VENOUS: 1.9 mmol/L (ref 0.5–2.0)
LACTIC ACID, VENOUS: 1.1 mmol/L (ref 0.5–2.0)

## 2015-04-11 LAB — ALDOLASE

## 2015-04-11 MED ORDER — IBUPROFEN 400 MG PO TABS
400.0000 mg | ORAL_TABLET | ORAL | Status: AC
  Administered 2015-04-11: 400 mg via ORAL
  Filled 2015-04-11: qty 1

## 2015-04-11 MED ORDER — SODIUM CHLORIDE 0.9 % IV BOLUS (SEPSIS)
1000.0000 mL | Freq: Once | INTRAVENOUS | Status: AC
  Administered 2015-04-11: 1000 mL via INTRAVENOUS
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
## 2015-04-11 NOTE — Progress Notes (Signed)
Pt complaint of not getting fluids when she asks.  The Tech and I are working together to meet pts needs.  Patient asked for orange juice and ice.  The tech took patient orange juice and ice.

## 2015-04-12 DIAGNOSIS — R000 Tachycardia, unspecified: Secondary | ICD-10-CM

## 2015-04-12 DIAGNOSIS — N390 Urinary tract infection, site not specified: Secondary | ICD-10-CM

## 2015-04-12 DIAGNOSIS — S0993XD Unspecified injury of face, subsequent encounter: Secondary | ICD-10-CM

## 2015-04-12 DIAGNOSIS — F10220 Alcohol dependence with intoxication, uncomplicated: Secondary | ICD-10-CM

## 2015-04-12 LAB — COMPREHENSIVE METABOLIC PANEL
ALT: 148 U/L — AB (ref 14–54)
ANION GAP: 7 (ref 5–15)
AST: 288 U/L — ABNORMAL HIGH (ref 15–41)
Albumin: 2.4 g/dL — ABNORMAL LOW (ref 3.5–5.0)
Alkaline Phosphatase: 44 U/L (ref 38–126)
BUN: <5 mg/dL — ABNORMAL LOW (ref 6–20)
CALCIUM: 8.4 mg/dL — AB (ref 8.9–10.3)
CHLORIDE: 107 mmol/L (ref 101–111)
CO2: 24 mmol/L (ref 22–32)
CREATININE: 0.73 mg/dL (ref 0.44–1.00)
Glucose, Bld: 103 mg/dL — ABNORMAL HIGH (ref 65–99)
Potassium: 4.2 mmol/L (ref 3.5–5.1)
Sodium: 138 mmol/L (ref 135–145)
Total Bilirubin: 0.2 mg/dL — ABNORMAL LOW (ref 0.3–1.2)
Total Protein: 5.5 g/dL — ABNORMAL LOW (ref 6.5–8.1)

## 2015-04-12 LAB — URINE CULTURE: Culture: >=100000

## 2015-04-12 LAB — CBC WITH DIFFERENTIAL/PLATELET
BASOS ABS: 0 10*3/uL (ref 0.0–0.1)
Basophils Relative: 0 %
EOS ABS: 0.3 10*3/uL (ref 0.0–0.7)
EOS PCT: 4 %
HCT: 24.2 % — ABNORMAL LOW (ref 36.0–46.0)
Hemoglobin: 7.8 g/dL — ABNORMAL LOW (ref 12.0–15.0)
LYMPHS PCT: 19 %
Lymphs Abs: 1.7 10*3/uL (ref 0.7–4.0)
MCH: 29.2 pg (ref 26.0–34.0)
MCHC: 32.2 g/dL (ref 30.0–36.0)
MCV: 90.6 fL (ref 78.0–100.0)
Monocytes Absolute: 0.6 10*3/uL (ref 0.1–1.0)
Monocytes Relative: 7 %
Neutro Abs: 6.5 10*3/uL (ref 1.7–7.7)
Neutrophils Relative %: 70 %
PLATELETS: 227 10*3/uL (ref 150–400)
RBC: 2.67 MIL/uL — AB (ref 3.87–5.11)
RDW: 16.7 % — ABNORMAL HIGH (ref 11.5–15.5)
WBC: 9.1 10*3/uL (ref 4.0–10.5)

## 2015-04-12 LAB — CK: CK TOTAL: 19298 U/L — AB (ref 38–234)

## 2015-04-12 MED ORDER — AMOXICILLIN 500 MG PO CAPS
500.0000 mg | ORAL_CAPSULE | Freq: Three times a day (TID) | ORAL | Status: DC
  Administered 2015-04-12 – 2015-04-16 (×12): 500 mg via ORAL
  Filled 2015-04-12 (×19): qty 1
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
## 2015-04-13 DIAGNOSIS — S0993XS Unspecified injury of face, sequela: Secondary | ICD-10-CM

## 2015-04-13 DIAGNOSIS — F1020 Alcohol dependence, uncomplicated: Secondary | ICD-10-CM

## 2015-04-13 DIAGNOSIS — N390 Urinary tract infection, site not specified: Secondary | ICD-10-CM

## 2015-04-13 LAB — COMPREHENSIVE METABOLIC PANEL
ALK PHOS: 41 U/L (ref 38–126)
ALT: 117 U/L — AB (ref 14–54)
AST: 147 U/L — ABNORMAL HIGH (ref 15–41)
Albumin: 2.6 g/dL — ABNORMAL LOW (ref 3.5–5.0)
Anion gap: 7 (ref 5–15)
BUN: <5 mg/dL — ABNORMAL LOW (ref 6–20)
CALCIUM: 8.5 mg/dL — AB (ref 8.9–10.3)
CO2: 25 mmol/L (ref 22–32)
CREATININE: 0.81 mg/dL (ref 0.44–1.00)
Chloride: 107 mmol/L (ref 101–111)
Glucose, Bld: 106 mg/dL — ABNORMAL HIGH (ref 65–99)
Potassium: 3.8 mmol/L (ref 3.5–5.1)
Sodium: 139 mmol/L (ref 135–145)
Total Bilirubin: 0.5 mg/dL (ref 0.3–1.2)
Total Protein: 5.5 g/dL — ABNORMAL LOW (ref 6.5–8.1)

## 2015-04-13 LAB — CK: CK TOTAL: 7464 U/L — AB (ref 38–234)

## 2015-04-13 MED ORDER — BISACODYL 10 MG RE SUPP
10.0000 mg | Freq: Once | RECTAL | Status: AC
  Administered 2015-04-13: 10 mg via RECTAL
  Filled 2015-04-13: qty 1
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
## 2015-04-14 LAB — COMPREHENSIVE METABOLIC PANEL
ALK PHOS: 39 U/L (ref 38–126)
ALT: 94 U/L — ABNORMAL HIGH (ref 14–54)
ANION GAP: 6 (ref 5–15)
AST: 81 U/L — ABNORMAL HIGH (ref 15–41)
Albumin: 2.5 g/dL — ABNORMAL LOW (ref 3.5–5.0)
BILIRUBIN TOTAL: 0.2 mg/dL — AB (ref 0.3–1.2)
BUN: <5 mg/dL — ABNORMAL LOW (ref 6–20)
CALCIUM: 8.4 mg/dL — AB (ref 8.9–10.3)
CO2: 24 mmol/L (ref 22–32)
Chloride: 109 mmol/L (ref 101–111)
Creatinine, Ser: 0.83 mg/dL (ref 0.44–1.00)
GLUCOSE: 99 mg/dL (ref 65–99)
Potassium: 3.7 mmol/L (ref 3.5–5.1)
Sodium: 139 mmol/L (ref 135–145)
TOTAL PROTEIN: 5.5 g/dL — AB (ref 6.5–8.1)

## 2015-04-14 LAB — CK: CK TOTAL: 3604 U/L — AB (ref 38–234)
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
## 2015-04-15 ENCOUNTER — Inpatient Hospital Stay (HOSPITAL_COMMUNITY): Payer: Medicaid Other

## 2015-04-15 LAB — CULTURE, BLOOD (ROUTINE X 2)
CULTURE: NO GROWTH
Culture: NO GROWTH

## 2015-04-15 LAB — COMPREHENSIVE METABOLIC PANEL
ALBUMIN: 2.8 g/dL — AB (ref 3.5–5.0)
ALK PHOS: 42 U/L (ref 38–126)
ALT: 83 U/L — ABNORMAL HIGH (ref 14–54)
AST: 58 U/L — AB (ref 15–41)
Anion gap: 7 (ref 5–15)
BILIRUBIN TOTAL: 0.2 mg/dL — AB (ref 0.3–1.2)
CO2: 27 mmol/L (ref 22–32)
Calcium: 9 mg/dL (ref 8.9–10.3)
Chloride: 106 mmol/L (ref 101–111)
Creatinine, Ser: 0.73 mg/dL (ref 0.44–1.00)
GFR calc Af Amer: >60 mL/min (ref >60)
GFR calc non Af Amer: >60 mL/min (ref >60)
GLUCOSE: 95 mg/dL (ref 65–99)
POTASSIUM: 4.3 mmol/L (ref 3.5–5.1)
Sodium: 140 mmol/L (ref 135–145)
TOTAL PROTEIN: 6.4 g/dL — AB (ref 6.5–8.1)

## 2015-04-15 LAB — CBC
HCT: 26.3 % — ABNORMAL LOW (ref 36.0–46.0)
HEMOGLOBIN: 8.7 g/dL — AB (ref 12.0–15.0)
MCH: 29.9 pg (ref 26.0–34.0)
MCHC: 33.1 g/dL (ref 30.0–36.0)
MCV: 90.4 fL (ref 78.0–100.0)
Platelets: 385 10*3/uL (ref 150–400)
RBC: 2.91 MIL/uL — ABNORMAL LOW (ref 3.87–5.11)
RDW: 16.2 % — ABNORMAL HIGH (ref 11.5–15.5)
WBC: 7.6 10*3/uL (ref 4.0–10.5)

## 2015-04-15 LAB — CK: Total CK: 2457 U/L — ABNORMAL HIGH (ref 38–234)
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
## 2015-04-15 NOTE — Progress Notes (Signed)
Patient stated has her vision is blurry. She stated that she noticed the change in her noticed earlier in the day (04/14/15). She stated that this was first time telling anyone because she thought blurry vision would go away. She was able to read sentences from [place] board. PA on-call made aware.
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
## 2015-04-16 LAB — COMPREHENSIVE METABOLIC PANEL
ALK PHOS: 41 U/L (ref 38–126)
ALT: 65 U/L — AB (ref 14–54)
AST: 43 U/L — AB (ref 15–41)
Albumin: 2.9 g/dL — ABNORMAL LOW (ref 3.5–5.0)
Anion gap: 7 (ref 5–15)
CALCIUM: 9 mg/dL (ref 8.9–10.3)
CO2: 26 mmol/L (ref 22–32)
CREATININE: 0.7 mg/dL (ref 0.44–1.00)
Chloride: 107 mmol/L (ref 101–111)
Glucose, Bld: 91 mg/dL (ref 65–99)
Potassium: 3.3 mmol/L — ABNORMAL LOW (ref 3.5–5.1)
Sodium: 140 mmol/L (ref 135–145)
Total Bilirubin: 0.2 mg/dL — ABNORMAL LOW (ref 0.3–1.2)
Total Protein: 6.1 g/dL — ABNORMAL LOW (ref 6.5–8.1)

## 2015-04-16 LAB — CK: CK TOTAL: 1495 U/L — AB (ref 38–234)

## 2015-04-16 NOTE — Care Management Note (Signed)
Case Management Note  Patient Details  Name: Paula Cortez MRN: [id number] Date of Birth: 08/16/1995  Subjective/Objective:   Patient is for dc today, NCM gave patient brochure for [facility]llow up [facility]  Patient did not take her head from under the covers she just stated ok after NCM explained to her about her apt follow up.                  Action/Plan:   Expected Discharge Date:   (unknown)               Expected Discharge Plan:  Home/Self Care  In-House Referral:  Clinical Social Work  Discharge planning Services  CM Consult, [facility], Follow-up appt scheduled  Post Acute Care Choice:    Choice offered to:     DME Arranged:    DME Agency:     HH Arranged:    HH Agency:     Status of Service:  Completed, signed off  Medicare Important Message Given:    Date Medicare IM Given:    Medicare IM give by:    Date Additional Medicare IM Given:    Additional Medicare Important Message give by:     If discussed at Long Length of Stay Meetings, dates discussed:    Additional Comments:  Leone Havenaylor, Allex [place], RN 04/16/2015, 12:51 PM

## 2015-04-16 NOTE — Progress Notes (Signed)
NURSING PROGRESS NOTE  Paula Cortez [id number] Discharge Data: 04/16/2015 9:58 AM Attending Provider: Catarina Hartshornavid Tat, MD PCP:No primary care provider on file.     Emberlyn Shisler to be D/C'd Home per MD order.  Discussed with the patient the After Visit Summary and all questions fully answered. All IV's discontinued with no bleeding noted. All belongings returned to patient for patient to take home.   Last Vital Signs:  Blood pressure 106/68, pulse 81, temperature 99.4 F (37.4 C), temperature source Oral, resp. rate 15, height 5\' 3"  (1.6 m), weight 60.555 kg (133 lb 8 oz), last menstrual period 03/08/2014, SpO2 100 %.  Discharge Medication List   Medication List    STOP taking these medications        carbamazepine 200 MG tablet  Commonly known as:  TEGRETOL      TAKE these medications        FLUoxetine 20 MG capsule  Commonly known as:  PROZAC  Take 20 mg by mouth daily.     mirtazapine 15 MG tablet  Commonly known as:  REMERON  Take 15 mg by mouth at bedtime as needed. sleep         Bennie Pieriniyndi Ariannah Arenson, RN

## 2015-05-01 ENCOUNTER — Ambulatory Visit: Payer: Self-pay | Admitting: Family Medicine

## 2016-01-01 DIAGNOSIS — F32A Depression, unspecified: Secondary | ICD-10-CM

## 2016-01-01 DIAGNOSIS — Z5900 Homelessness unspecified: Secondary | ICD-10-CM

## 2016-01-01 DIAGNOSIS — F329 Major depressive disorder, single episode, unspecified: Secondary | ICD-10-CM

## 2016-01-01 DIAGNOSIS — Z0000 Encounter for general adult medical examination without abnormal findings: Secondary | ICD-10-CM

## 2016-01-01 NOTE — Congregational Nurse Program (Signed)
Congregational Nurse Program Note  Date of Encounter: 01/01/2016  Past Medical History: Past Medical History:  Diagnosis Date  . Depression 04/06/2015    Encounter Details:     CNP Questionnaire - 01/01/16 2001      Patient Demographics   Is this a new or existing patient? New   Patient is considered a/an Not Applicable   Race African-American/Black     Patient Assistance   Location of Patient Assistance Not Applicable   Patient's financial/insurance status Medicaid   Uninsured Patient No   Patient referred to apply for the following financial assistance Not Applicable   Food insecurities addressed Not Applicable   Transportation assistance No   Assistance securing medications No   Educational health offerings Navigating the healthcare system;Behavioral health     Encounter Details   Primary purpose of visit Education/Health Concerns;Spiritual Care/Support Visit;Navigating the Healthcare System   Was an Emergency Department visit averted? Not Applicable   Does patient have a medical provider? No   Patient referred to Establish PCP;Area Agency   Was a mental health screening completed? (GAINS tool) No   Does patient have dental issues? No   Does patient have vision issues? No   Does your patient have an abnormal blood pressure today? No   Since previous encounter, have you referred patient for abnormal blood pressure that resulted in a new diagnosis or medication change? No   Does your patient have an abnormal blood glucose today? No   Since previous encounter, have you referred patient for abnormal blood glucose that resulted in a new diagnosis or medication change? No   Was there a life-saving intervention made? No    Initial visit  For this  20 year old ,homeless young  [place]  That  States she is depressed  And needs her  Medication. States 6-8 months ago  [place]  [facility]. Hasn't been on her medication  Since then and has started using drugs more and  more . Using  Teodoro SprayEustacy, marjauina and  Alcohol States she  Uses when she  Gets  Depressed and with her friends . Nurse counseled regarding drug  Use  , mixing  Of  Drugs  And outcomes. Client became emotional  And  Nurse listened and was supportive as she encouraged her to get help  And turn her life around. Has no family support she  States. [facility]  For  PCP  And  Mental Health  And  Drug services.. Will follow up with her tomorrow  to see if she followed through.

## 2016-01-02 DIAGNOSIS — F32A Depression, unspecified: Secondary | ICD-10-CM

## 2016-01-02 DIAGNOSIS — Z5900 Homelessness unspecified: Secondary | ICD-10-CM

## 2016-01-02 DIAGNOSIS — F329 Major depressive disorder, single episode, unspecified: Secondary | ICD-10-CM

## 2016-01-02 DIAGNOSIS — Z0000 Encounter for general adult medical examination without abnormal findings: Secondary | ICD-10-CM

## 2016-01-02 NOTE — Congregational Nurse Program (Signed)
Congregational Nurse Program Note  Date of Encounter: 01/02/2016  Past Medical History: Past Medical History:  Diagnosis Date  . Depression 04/06/2015    Encounter Details:     CNP Questionnaire - 01/02/16 2231      Patient Demographics   Is this a new or existing patient? Existing   Patient is considered a/an Not Applicable   Race African-American/Black     Patient Assistance   Location of Patient Assistance Not Applicable   Patient's financial/insurance status Medicaid   Uninsured Patient No   Patient referred to apply for the following financial assistance Not Applicable   Food insecurities addressed Not Applicable   Transportation assistance No   Assistance securing medications No   Educational health offerings Behavioral health;Medications     Encounter Details   Primary purpose of visit Spiritual Care/Support Visit;Education/Health Concerns;Chronic Illness/Condition Visit   Was an Emergency Department visit averted? Not Applicable   Does patient have a medical provider? No   Patient referred to Establish PCP;Area Agency   Was a mental health screening completed? (GAINS tool) No   Does patient have dental issues? No   Does patient have vision issues? No   Does your patient have an abnormal blood pressure today? No   Since previous encounter, have you referred patient for abnormal blood pressure that resulted in a new diagnosis or medication change? No   Does your patient have an abnormal blood glucose today? No   Since previous encounter, have you referred patient for abnormal blood glucose that resulted in a new diagnosis or medication change? No   Was there a life-saving intervention made? No      to see if she made contact to establish PCP.Client  States she did not follow through but will make the call Friday.. Nurse encouraged client to take the steps to take care of herself. [facility]

## 2016-01-15 DIAGNOSIS — Z0000 Encounter for general adult medical examination without abnormal findings: Secondary | ICD-10-CM

## 2016-01-15 DIAGNOSIS — F32A Depression, unspecified: Secondary | ICD-10-CM

## 2016-01-15 DIAGNOSIS — Z5900 Homelessness unspecified: Secondary | ICD-10-CM

## 2016-01-15 DIAGNOSIS — F329 Major depressive disorder, single episode, unspecified: Secondary | ICD-10-CM

## 2016-01-21 NOTE — Congregational Nurse Program (Signed)
Congregational Nurse Program Note  Date of Encounter: 01/15/2016  Past Medical History: Past Medical History:  Diagnosis Date  . Depression 04/06/2015    Encounter Details:     CNP Questionnaire - 01/21/16 2249      Patient Demographics   Is this a new or existing patient? Existing   Patient is considered a/an Not Applicable   Race African-American/Black     Patient Assistance   Location of Patient Assistance Not Applicable   Patient's financial/insurance status Medicaid   Uninsured Patient No   Patient referred to apply for the following financial assistance Not Applicable   Food insecurities addressed Not Applicable   Transportation assistance No   Assistance securing medications No   Educational health offerings Navigating the healthcare system     Encounter Details   Primary purpose of visit Spiritual Care/Support Visit;Education/Health Concerns   Was an Emergency Department visit averted? Not Applicable   Does patient have a medical provider? No   Patient referred to Establish PCP;Area Agency   Was a mental health screening completed? (GAINS tool) No   Does patient have dental issues? No   Does patient have vision issues? No   Does your patient have an abnormal blood pressure today? No   Since previous encounter, have you referred patient for abnormal blood pressure that resulted in a new diagnosis or medication change? No   Does your patient have an abnormal blood glucose today? No   Since previous encounter, have you referred patient for abnormal blood glucose that resulted in a new diagnosis or medication change? No   Was there a life-saving intervention made? No      Brief  Visit . Client in to  Let  Nurse know  She has been working a lot  And hasnt had  Time to call or make  Appointment for  PCP  And treatment for depression. She  States Friday  I plan to follow  Up. Nurse encouraged her to take time to take care of her self and not  Wait until she  Feels bad  again. Seemed to want to follow  Through. Nurse will keep  Encouraging and supporting this young lady as she is working and trtying to pick herself up and leave some  Behaviors alone she feels  May  Be bad for her. Has little support. Looks much better today ,smiling  . Client  Continues to need support.

## 2016-08-25 ENCOUNTER — Emergency Department (HOSPITAL_COMMUNITY)
Admission: EM | Admit: 2016-08-25 | Discharge: 2016-08-26 | Disposition: A | Discharge status: Home / Self Care | Payer: Medicaid Other | Attending: Emergency Medicine | Admitting: Emergency Medicine

## 2016-08-25 ENCOUNTER — Encounter (HOSPITAL_COMMUNITY): Payer: Self-pay

## 2016-08-25 DIAGNOSIS — Z5321 Procedure and treatment not carried out due to patient leaving prior to being seen by health care provider: Secondary | ICD-10-CM | POA: Insufficient documentation

## 2016-08-25 DIAGNOSIS — R300 Dysuria: Secondary | ICD-10-CM | POA: Insufficient documentation

## 2016-08-25 LAB — URINALYSIS, ROUTINE W REFLEX MICROSCOPIC
Bacteria, UA: NONE SEEN
Bilirubin Urine: NEGATIVE
Glucose, UA: NEGATIVE mg/dL
Hgb urine dipstick: NEGATIVE
KETONES UR: NEGATIVE mg/dL
Leukocytes, UA: NEGATIVE
Nitrite: NEGATIVE
PH: 8 (ref 5.0–8.0)
Protein, ur: 30 mg/dL — AB
SPECIFIC GRAVITY, URINE: 1.019 (ref 1.005–1.030)

## 2016-08-25 NOTE — ED Notes (Signed)
Called pt X3 with no answer. Nurse first made aware.

## 2016-08-25 NOTE — ED Triage Notes (Signed)
Pt reports flank pain and burning with urination x 2 weeks. Also reports vaginal discharge that is like it was when she had BV.

## 2016-08-25 NOTE — ED Triage Notes (Signed)
PT called from front lobby ,no answer. 
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
## 2017-04-20 ENCOUNTER — Emergency Department (HOSPITAL_BASED_OUTPATIENT_CLINIC_OR_DEPARTMENT_OTHER)
Admission: EM | Admit: 2017-04-20 | Discharge: 2017-04-20 | Disposition: A | Discharge status: Home / Self Care | Payer: Self-pay | Attending: Emergency Medicine | Admitting: Emergency Medicine

## 2017-04-20 ENCOUNTER — Emergency Department (HOSPITAL_BASED_OUTPATIENT_CLINIC_OR_DEPARTMENT_OTHER): Payer: Self-pay

## 2017-04-20 ENCOUNTER — Encounter (HOSPITAL_BASED_OUTPATIENT_CLINIC_OR_DEPARTMENT_OTHER): Payer: Self-pay | Admitting: *Deleted

## 2017-04-20 ENCOUNTER — Other Ambulatory Visit: Payer: Self-pay

## 2017-04-20 DIAGNOSIS — M795 Residual foreign body in soft tissue: Secondary | ICD-10-CM

## 2017-04-20 DIAGNOSIS — Y998 Other external cause status: Secondary | ICD-10-CM | POA: Insufficient documentation

## 2017-04-20 DIAGNOSIS — S90852A Superficial foreign body, left foot, initial encounter: Secondary | ICD-10-CM | POA: Insufficient documentation

## 2017-04-20 DIAGNOSIS — X58XXXA Exposure to other specified factors, initial encounter: Secondary | ICD-10-CM | POA: Insufficient documentation

## 2017-04-20 DIAGNOSIS — F17210 Nicotine dependence, cigarettes, uncomplicated: Secondary | ICD-10-CM | POA: Insufficient documentation

## 2017-04-20 DIAGNOSIS — F329 Major depressive disorder, single episode, unspecified: Secondary | ICD-10-CM | POA: Insufficient documentation

## 2017-04-20 DIAGNOSIS — Z79899 Other long term (current) drug therapy: Secondary | ICD-10-CM | POA: Insufficient documentation

## 2017-04-20 DIAGNOSIS — Y9389 Activity, other specified: Secondary | ICD-10-CM | POA: Insufficient documentation

## 2017-04-20 DIAGNOSIS — Y929 Unspecified place or not applicable: Secondary | ICD-10-CM | POA: Insufficient documentation

## 2017-04-20 DIAGNOSIS — F1020 Alcohol dependence, uncomplicated: Secondary | ICD-10-CM | POA: Insufficient documentation

## 2017-04-20 MED ORDER — LIDOCAINE HCL 2 % IJ SOLN
10.0000 mL | Freq: Once | INTRAMUSCULAR | Status: AC
  Administered 2017-04-20: 1 mg via INTRADERMAL
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
## 2017-04-20 NOTE — ED Triage Notes (Signed)
Possible glass in the bottom of her left foot. She removed some glass but pain continues when she steps.

## 2017-04-20 NOTE — Discharge Instructions (Signed)
Take tylenol, motrin for pain.   Keep area clean. Avoid walking bare foot   See your doctor  Return to ER if you have worse redness, drainage from the site, fever

## 2021-07-18 ENCOUNTER — Ambulatory Visit (HOSPITAL_COMMUNITY)
Admission: EM | Admit: 2021-07-18 | Discharge: 2021-07-18 | Disposition: A | Discharge status: Home / Self Care | Payer: No Payment, Other

## 2021-07-29 ENCOUNTER — Telehealth (HOSPITAL_COMMUNITY): Payer: Self-pay | Admitting: Emergency Medicine

## 2021-07-29 NOTE — BH Assessment (Signed)
Care Management - [facility] Follow Up Discharges  ? ?Per chart review, patient left without being seem.  ? ?
